# Patient Record
Sex: Female | Born: 1975 | Race: White | Hispanic: No | Marital: Single | Smoking: Current every day smoker
Health system: Southern US, Community
[De-identification: ages and names within clinical notes are randomized; demographics above are authoritative.]

## PROBLEM LIST (undated history)

## (undated) HISTORY — PX: KNEE SURGERY: SHX244

## (undated) HISTORY — PX: TONSILLECTOMY: SUR1361

## (undated) HISTORY — PX: TUBAL LIGATION: SHX77

## (undated) HISTORY — PX: CHOLECYSTECTOMY: SHX55

---

## 1999-01-29 ENCOUNTER — Ambulatory Visit (HOSPITAL_COMMUNITY): Admission: RE | Admit: 1999-01-29 | Discharge: 1999-01-29 | Payer: Self-pay | Admitting: *Deleted

## 2005-10-29 ENCOUNTER — Emergency Department: Payer: Self-pay | Admitting: Emergency Medicine

## 2016-11-13 ENCOUNTER — Encounter: Payer: Self-pay | Admitting: Emergency Medicine

## 2016-11-13 ENCOUNTER — Emergency Department
Admission: EM | Admit: 2016-11-13 | Discharge: 2016-11-13 | Disposition: A | Discharge status: Home / Self Care | Payer: Self-pay | Attending: Emergency Medicine | Admitting: Emergency Medicine

## 2016-11-13 ENCOUNTER — Emergency Department: Payer: Self-pay

## 2016-11-13 DIAGNOSIS — F17210 Nicotine dependence, cigarettes, uncomplicated: Secondary | ICD-10-CM | POA: Insufficient documentation

## 2016-11-13 DIAGNOSIS — Z9049 Acquired absence of other specified parts of digestive tract: Secondary | ICD-10-CM | POA: Insufficient documentation

## 2016-11-13 DIAGNOSIS — M549 Dorsalgia, unspecified: Secondary | ICD-10-CM | POA: Insufficient documentation

## 2016-11-13 DIAGNOSIS — R1030 Lower abdominal pain, unspecified: Secondary | ICD-10-CM | POA: Insufficient documentation

## 2016-11-13 DIAGNOSIS — R109 Unspecified abdominal pain: Secondary | ICD-10-CM

## 2016-11-13 DIAGNOSIS — R300 Dysuria: Secondary | ICD-10-CM | POA: Insufficient documentation

## 2016-11-13 DIAGNOSIS — Z711 Person with feared health complaint in whom no diagnosis is made: Secondary | ICD-10-CM | POA: Insufficient documentation

## 2016-11-13 LAB — URINALYSIS, COMPLETE (UACMP) WITH MICROSCOPIC
Bacteria, UA: NONE SEEN
Bilirubin Urine: NEGATIVE
Glucose, UA: NEGATIVE mg/dL
Ketones, ur: NEGATIVE mg/dL
Nitrite: NEGATIVE
Protein, ur: NEGATIVE mg/dL
Specific Gravity, Urine: 1.017 (ref 1.005–1.030)
pH: 5 (ref 5.0–8.0)

## 2016-11-13 LAB — PREGNANCY, URINE: Preg Test, Ur: NEGATIVE

## 2016-11-13 NOTE — ED Notes (Signed)
Pt to ed with c/o urinary frequency and discomfort intermittent x 2 weeks.  Denies back pain.

## 2016-11-13 NOTE — ED Triage Notes (Signed)
Urinary frequency and discomfort intermittent x 2 weeks.

## 2016-11-13 NOTE — ED Provider Notes (Signed)
[facility] Emergency Department Provider Note   ____________________________________________   I have reviewed the triage vital signs and the nursing notes.   HISTORY  Chief Complaint Dysuria    HPI Theresa Garrett is a 41 y.o. female presents to emergency department with dysuria, increased urinary frequency and intermittent lower abdominal pain for approximately 2 weeks. Patient also reports lumbar back pain and right side flank pain for approximately one week. Patient denies any recent traumatic injury or lifting injury in relation to back pain. Patient denies any significant history of recurring urinary tract infections kidney infections or kidney stones.Patient does not endorse fevers, chills, nausea, vomiting or headache associated with the above symptoms. Patient did not note any blood in her urine, change in color or odor. Patient denies fever, chills, headache, vision changes, chest pain, chest tightness, shortness of breath, abdominal pain, nausea and vomiting.  History reviewed. No pertinent past medical history.  There are no active problems to display for this patient.   Past Surgical History:  Procedure Laterality Date  . CHOLECYSTECTOMY    . KNEE SURGERY    . TONSILLECTOMY    . TUBAL LIGATION      Prior to Admission medications   Not on File    Allergies Ibuprofen and Morphine and related  No family history on file.  Social History Social History  Substance Use Topics  . Smoking status: Current Every Day Smoker    Packs/day: 0.25    Types: Cigarettes  . Smokeless tobacco: Not on file  . Alcohol use Not on file    Review of Systems Constitutional: Negative for fever/chills Cardiovascular: Denies chest pain. Respiratory: Denies cough. Denies shortness of breath. Gastrointestinal: Lower abdominal pain.  No nausea, vomiting, diarrhea. Genitourinary: Positive for dysuria and increased urine frequency. Musculoskeletal:  positive for lumbar back pain and right-sided flank pain. Skin: Negative for rash. Neurological: Negative for headaches.   ____________________________________________   PHYSICAL EXAM:  VITAL SIGNS: ED Triage Vitals  Enc Vitals Group     BP 11/13/16 0906 (!) 167/94     Pulse Rate 11/13/16 0906 100     Resp 11/13/16 0906 18     Temp 11/13/16 0906 97.6 F (36.4 C)     Temp Source 11/13/16 0906 Oral     SpO2 11/13/16 0906 100 %     Weight 11/13/16 0906 140 lb (63.5 kg)     Height 11/13/16 0906  (1.6 m)     Head Circumference --      Peak Flow --      Pain Score 11/13/16 0905 5     Pain Loc --      Pain Edu? --      Excl. in GC? --     Constitutional: Alert and oriented. Well appearing and in no acute distress.  Eyes: Conjunctivae are normal. PERRL. EOMI  Head: Normocephalic and atraumatic. Neck:Supple. No thyromegaly. No stridor.  Cardiovascular: Normal rate, regular rhythm. Good peripheral circulation. Respiratory: Normal respiratory effort without tachypnea or retractions. Lungs CTAB. No wheezes/rales/rhonchi. Good air entry to the bases with no decreased or absent breath sounds. Hematological/Lymphatic/Immunological: No cervical lymphadenopathy. Cardiovascular: Normal rate, regular rhythm. Normal distal pulses. Gastrointestinal: Bowel sounds 4 quadrants. Soft and nontender to palpation. No guarding or rigidity. No palpable masses. No distention. Right side CVA tenderness. Genitourinary: Positive for dysuria and increased urine frequency. No vaginal discharge. Musculoskeletal: Lumbar back and right side flank pain.  Neurologic: Normal speech and language.  Skin:  Skin is warm, dry and intact. No rash noted. Psychiatric: Mood and affect are normal. Speech and behavior are normal. Patient exhibits appropriate insight and judgement.  ____________________________________________   LABS (all labs ordered are listed, but only abnormal results are displayed)  Labs  Reviewed  URINALYSIS, COMPLETE (UACMP) WITH MICROSCOPIC - Abnormal; Notable for the following:       Result Value   Color, Urine YELLOW (*)    APPearance CLOUDY (*)    Hgb urine dipstick SMALL (*)    Leukocytes, UA LARGE (*)    Squamous Epithelial / LPF 6-30 (*)    All other components within normal limits  PREGNANCY, URINE   ____________________________________________  EKG none ____________________________________________  RADIOLOGY CT renal study IMPRESSION: 1. No acute finding. No hydronephrosis or urolithiasis. 2. Colonic diverticulosis. 3. 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 ____________________________________________   PROCEDURES  Procedure(s) performed: no    Critical Care performed: no ____________________________________________   INITIAL IMPRESSION / ASSESSMENT AND PLAN / ED COURSE  Pertinent labs & imaging results that were available during my care of the patient were reviewed by me and considered in my medical decision making (see chart for details).  Patient presents to emergency department with dysuria, increased frequency, back pain and right-sided flank pain. History, physical exam findings, labs and imaging are reassuring symptoms are not related to an infectious process ongoing. Recommended patient encourage hydration, and sure adequate diet forbowel and bladder functions and continue monitoring current symptoms she is experiencing for continued improvement. I discussed skeletal skeletal findings noted on her CT renal study with this being the possible source of her back pain. I offered her recommendations for pain management and she stated she would take over-the-counter Tylenol or NSAIDs. Reassessment of vital signs were reassuring at the time of discharge. All patient questions were answered. Patient advised to follow up with PCP as needed or return to the emergency department if symptoms return or worsen. Patient informed of  clinical course, understand medical decision-making process, and agree with plan.      ____________________________________________   FINAL CLINICAL IMPRESSION(S) / ED DIAGNOSES  Final diagnoses:  Dysuria  Flank pain  Other acute back pain  Medical condition not demonstrated       NEW MEDICATIONS STARTED DURING THIS VISIT:  There are no discharge medications for this patient.    Note:  This document was prepared using Dragon voice recognition software and may include unintentional dictation errors.    Clois Comber, PA-C 11/13/16 1639    Governor Rooks, MD 11/18/16 [phone]

## 2016-11-13 NOTE — Discharge Instructions (Signed)
If you note symptoms not improving or significantly worsening do not hesitate to return to the emergency department.

## 2017-05-09 ENCOUNTER — Emergency Department
Admission: EM | Admit: 2017-05-09 | Discharge: 2017-05-09 | Disposition: A | Discharge status: Home / Self Care | Payer: Self-pay | Attending: Emergency Medicine | Admitting: Emergency Medicine

## 2017-05-09 ENCOUNTER — Encounter: Payer: Self-pay | Admitting: Medical Oncology

## 2017-05-09 DIAGNOSIS — R1013 Epigastric pain: Secondary | ICD-10-CM | POA: Insufficient documentation

## 2017-05-09 DIAGNOSIS — R197 Diarrhea, unspecified: Secondary | ICD-10-CM | POA: Insufficient documentation

## 2017-05-09 DIAGNOSIS — R52 Pain, unspecified: Secondary | ICD-10-CM

## 2017-05-09 DIAGNOSIS — R112 Nausea with vomiting, unspecified: Secondary | ICD-10-CM | POA: Insufficient documentation

## 2017-05-09 DIAGNOSIS — F17210 Nicotine dependence, cigarettes, uncomplicated: Secondary | ICD-10-CM | POA: Insufficient documentation

## 2017-05-09 LAB — URINALYSIS, COMPLETE (UACMP) WITH MICROSCOPIC
BILIRUBIN URINE: NEGATIVE
Bacteria, UA: NONE SEEN
GLUCOSE, UA: NEGATIVE mg/dL
HGB URINE DIPSTICK: NEGATIVE
Ketones, ur: NEGATIVE mg/dL
NITRITE: NEGATIVE
Protein, ur: NEGATIVE mg/dL
Specific Gravity, Urine: 1.021 (ref 1.005–1.030)
pH: 5 (ref 5.0–8.0)

## 2017-05-09 LAB — COMPREHENSIVE METABOLIC PANEL
ALBUMIN: 4.2 g/dL (ref 3.5–5.0)
ALK PHOS: 46 U/L (ref 38–126)
ALT: 12 U/L — ABNORMAL LOW (ref 14–54)
ANION GAP: 7 (ref 5–15)
AST: 16 U/L (ref 15–41)
BILIRUBIN TOTAL: 0.6 mg/dL (ref 0.3–1.2)
BUN: 18 mg/dL (ref 6–20)
CALCIUM: 8.5 mg/dL — AB (ref 8.9–10.3)
CO2: 21 mmol/L — ABNORMAL LOW (ref 22–32)
Chloride: 110 mmol/L (ref 101–111)
Creatinine, Ser: 0.57 mg/dL (ref 0.44–1.00)
GFR calc Af Amer: >60 mL/min (ref >60)
Glucose, Bld: 114 mg/dL — ABNORMAL HIGH (ref 65–99)
POTASSIUM: 3.9 mmol/L (ref 3.5–5.1)
Sodium: 138 mmol/L (ref 135–145)
TOTAL PROTEIN: 7.3 g/dL (ref 6.5–8.1)

## 2017-05-09 LAB — CBC
HCT: 34.6 % — ABNORMAL LOW (ref 35.0–47.0)
HEMOGLOBIN: 10.9 g/dL — AB (ref 12.0–16.0)
MCH: 24.7 pg — ABNORMAL LOW (ref 26.0–34.0)
MCHC: 31.5 g/dL — ABNORMAL LOW (ref 32.0–36.0)
MCV: 78.4 fL — ABNORMAL LOW (ref 80.0–100.0)
Platelets: 234 10*3/uL (ref 150–440)
RBC: 4.42 MIL/uL (ref 3.80–5.20)
RDW: 17.4 % — AB (ref 11.5–14.5)
WBC: 14.8 10*3/uL — AB (ref 3.6–11.0)

## 2017-05-09 LAB — INFLUENZA PANEL BY PCR (TYPE A & B)
Influenza A By PCR: NEGATIVE
Influenza B By PCR: NEGATIVE

## 2017-05-09 LAB — POC URINE PREG, ED: Preg Test, Ur: NEGATIVE

## 2017-05-09 LAB — LIPASE, BLOOD: Lipase: 23 U/L (ref 11–51)

## 2017-05-09 MED ORDER — ONDANSETRON 4 MG PO TBDP: 4.0000 mg | ORAL_TABLET | Freq: Once | ORAL | Status: DC | PRN

## 2017-05-09 MED ORDER — ONDANSETRON 4 MG PO TBDP
ORAL_TABLET | ORAL | Status: AC
  Filled 2017-05-09: qty 1

## 2017-05-09 MED ORDER — LOPERAMIDE HCL 2 MG PO CAPS
4.0000 mg | ORAL_CAPSULE | Freq: Once | ORAL | Status: AC
  Administered 2017-05-09: 4 mg via ORAL
  Filled 2017-05-09: qty 2

## 2017-05-09 MED ORDER — SODIUM CHLORIDE 0.9 % IV BOLUS
1000.0000 mL | Freq: Once | INTRAVENOUS | Status: AC
  Administered 2017-05-09: 1000 mL via INTRAVENOUS

## 2017-05-09 MED ORDER — LOPERAMIDE HCL 2 MG PO TABS: 2.0000 mg | ORAL_TABLET | Freq: Four times a day (QID) | ORAL | 0 refills | Status: AC | PRN

## 2017-05-09 MED ORDER — ONDANSETRON HCL 4 MG/2ML IJ SOLN
4.0000 mg | Freq: Once | INTRAMUSCULAR | Status: AC
  Administered 2017-05-09: 4 mg via INTRAVENOUS
  Filled 2017-05-09: qty 2

## 2017-05-09 MED ORDER — ONDANSETRON 4 MG PO TBDP
4.0000 mg | ORAL_TABLET | Freq: Three times a day (TID) | ORAL | 0 refills | Status: AC | PRN
Start: 2017-05-09 — End: ?

## 2017-05-09 NOTE — ED Notes (Signed)
MD at bedside to talk to pt about results.

## 2017-05-09 NOTE — Discharge Instructions (Addendum)
Please take a clear liquid diet for the next 24-48 hours, then advance to a bland diet as tolerated.  You may take Zofran for nausea and vomiting, and loperamide is for diarrhea. ° °Make sure that you are washing your hands frequently and thoroughly to prevent the spread of infection. ° °Return to the emergency department if you develop lightheadedness, fainting, severe pain, fever, or any other symptoms concerning to you. °

## 2017-05-09 NOTE — ED Triage Notes (Signed)
Pt reports this am she began to have body aches, low grade temp, abd pain with nausea and vomiting.

## 2017-05-09 NOTE — ED Provider Notes (Signed)
[facility] Emergency Department Provider Note  ____________________________________________  Time seen: Approximately 12:26 PM  I have reviewed the triage vital signs and the nursing notes.   HISTORY  Chief Complaint Emesis; Generalized Body Aches; and Abdominal Pain    HPI Theresa Garrett is a 42 y.o. female, with a known PUD, presenting with acute onset of nausea vomiting and diarrhea, diffuse abdominal cramping and body aches.  The patient reports that she drove her daughter to work at 7:30 AM and by the time she got home, she was unable to tolerate anything.  She tried Sprite but this did not calm her stomach.  She has a history of peptic ulcer disease cramping in the area of her ulcer but this is not out of the norm for her.  She has not had any fever, chills, dysuria, urinary frequency.  The patient did have her influenza vaccination this year.  History reviewed. No pertinent past medical history.  There are no active problems to display for this patient.   Past Surgical History:  Procedure Laterality Date  . CHOLECYSTECTOMY    . KNEE SURGERY    . TONSILLECTOMY    . TUBAL LIGATION        Allergies Ibuprofen and Morphine and related  No family history on file.  Social History Social History   Tobacco Use  . Smoking status: Current Every Day Smoker    Packs/day: 0.25    Types: Cigarettes  Substance Use Topics  . Alcohol use: Not on file  . Drug use: Not on file    Review of Systems Constitutional: No fever/chills.  Lightheadedness or syncope.  Positive generalized malaise.  Positive myalgias.   Eyes: No visual changes.  No eye discharge. ENT: No sore throat. No congestion or rhinorrhea.  No ear pain. Cardiovascular: Denies chest pain. Denies palpitations. Respiratory: Denies shortness of breath.  No cough. Gastrointestinal: Positive epigastric chronic unchanged abdominal pain.  +nausea, +vomiting.  +diarrhea.  No  constipation. Genitourinary: Negative for dysuria.  No hematuria.  No urinary frequency. Musculoskeletal: Negative for back pain. Skin: Negative for rash. Neurological: Negative for headaches. No focal numbness, tingling or weakness.    ____________________________________________   PHYSICAL EXAM:  VITAL SIGNS: ED Triage Vitals [05/09/17 1150]  Enc Vitals Group     BP 140/81     Pulse Rate 91     Resp 18     Temp 99.2 F (37.3 C)     Temp Source Oral     SpO2 98 %     Weight 140 lb (63.5 kg)     Height 5\' 3"  (1.6 m)     Head Circumference      Peak Flow      Pain Score 7     Pain Loc      Pain Edu?      Excl. in GC?     Constitutional: Alert and oriented. Well appearing and in no acute distress. Answers questions appropriately. Eyes: Conjunctivae are normal.  EOMI. No scleral icterus.  No eye discharge. Head: Atraumatic. Nose: Positive congestion without rhinorrhea. Mouth/Throat: Mucous membranes are moist.  No posterior pharyngeal erythema, tonsillar swelling or exudate.  Posterior palate is symmetric and uvula is midline peer Neck: No stridor.  Supple.  No JVD.  No meningismus. Cardiovascular: Normal rate, regular rhythm. No murmurs, rubs or gallops.  Respiratory: Normal respiratory effort.  No accessory muscle use or retractions. Lungs CTAB.  No wheezes, rales or ronchi. Gastrointestinal: Soft, and nondistended.  Minimal epigastric tenderness to palpation.  No right upper quadrant pain or Murphy sign.  No guarding or rebound.  No peritoneal signs. Musculoskeletal: No LE edema. Neurologic:  A&Ox3.  Speech is clear.  Face and smile are symmetric.  EOMI.  Moves all extremities well. Skin:  Skin is warm, dry and intact. No rash noted. Psychiatric: Mood and affect are normal. Speech and behavior are normal.  Normal judgement.  ____________________________________________   LABS (all labs ordered are listed, but only abnormal results are displayed)  Labs Reviewed   COMPREHENSIVE METABOLIC PANEL - Abnormal; Notable for the following components:      Result Value   CO2 21 (*)    Glucose, Bld 114 (*)    Calcium 8.5 (*)    ALT 12 (*)    All other components within normal limits  CBC - Abnormal; Notable for the following components:   WBC 14.8 (*)    Hemoglobin 10.9 (*)    HCT 34.6 (*)    MCV 78.4 (*)    MCH 24.7 (*)    MCHC 31.5 (*)    RDW 17.4 (*)    All other components within normal limits  URINALYSIS, COMPLETE (UACMP) WITH MICROSCOPIC - Abnormal; Notable for the following components:   Color, Urine YELLOW (*)    APPearance HAZY (*)    Leukocytes, UA TRACE (*)    Squamous Epithelial / LPF 6-30 (*)    All other components within normal limits  LIPASE, BLOOD  INFLUENZA PANEL BY PCR (TYPE A & B)  POC URINE PREG, ED   ____________________________________________  EKG  Not indicated ____________________________________________  RADIOLOGY  No results found.  ____________________________________________   PROCEDURES  Procedure(s) performed: None  Procedures  Critical Care performed: No ____________________________________________   INITIAL IMPRESSION / ASSESSMENT AND PLAN / ED COURSE  Pertinent labs & imaging results that were available during my care of the patient were reviewed by me and considered in my medical decision making (see chart for details).  42 y.o. female presenting with acute onset of nausea vomiting diarrhea, body aches and epigastric discomfort which is not grossly abnormal from her chronic peptic ulcer disease pain.  Overall, the patient is well-appearing and afebrile.  Her abdominal examination is reassuring and I do not suspect an intra-abdominal surgical pathology or life-threatening infection today.  Her symptoms are most consistent with viral or foodborne GI illness, or influenza.  We will plan to treat the patient symptomatically, and get basic laboratory studies.  Rule out pregnancy and UTI.  Reevaluate  for final disposition.  ----------------------------------------- 1:41 PM on 05/09/2017 -----------------------------------------  The patient's influenza testing and pregnancy test are negative.  Her lipase is normal and her electrolytes are reassuring.  Her hepatic function panel is also reassuring.  Her CBC shows a white blood cell count of 14.8 which is consistent with her infectious presentation.  Her UA does not show a UTI.  At this time, the patient is feeling significantly better and her symptoms have resolved.  She is able to take liquids without vomiting.  At this time the patient is stable for discharge; follow-up instructions as well as return precautions were discussed.  ____________________________________________  FINAL CLINICAL IMPRESSION(S) / ED DIAGNOSES  Final diagnoses:  Nausea vomiting and diarrhea  Epigastric pain  Generalized body aches         NEW MEDICATIONS STARTED DURING THIS VISIT:  New Prescriptions   LOPERAMIDE (IMODIUM A-D) 2 MG TABLET    Take 1 tablet (2 mg total)  by mouth 4 (four) times daily as needed for diarrhea or loose stools.   ONDANSETRON (ZOFRAN ODT) 4 MG DISINTEGRATING TABLET    Take 1 tablet (4 mg total) by mouth every 8 (eight) hours as needed for nausea or vomiting.      Rockne MenghiniNorman, Anne-Caroline, MD 05/09/17 [phone]

## 2019-01-05 ENCOUNTER — Other Ambulatory Visit: Payer: Self-pay

## 2019-01-05 DIAGNOSIS — Z20822 Contact with and (suspected) exposure to covid-19: Secondary | ICD-10-CM

## 2019-01-08 LAB — NOVEL CORONAVIRUS, NAA: SARS-CoV-2, NAA: NOT DETECTED

## 2019-05-05 IMAGING — CT CT RENAL STONE PROTOCOL
3 of 4 series · 8 of 46 positions shown, 15 images · non-contrast
Comparison: None.

CLINICAL DATA: Flank pain on the left for 2 weeks. Stone disease
suspected.

EXAM:
CT ABDOMEN AND PELVIS WITHOUT CONTRAST
TECHNIQUE: Multidetector CT imaging of the abdomen and pelvis was performed
following the standard protocol without IV contrast.

[Series 4: lung bases · axial · 0.76mm/px · z∈[-168,-108]mm · 4 of 20 slices shown, 9 images]
[im 4/20  soft-tissue]
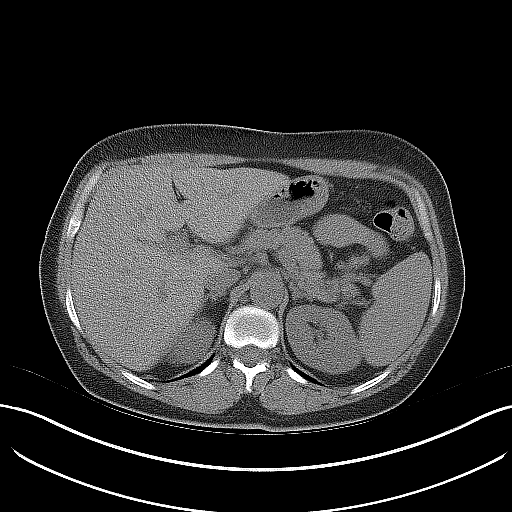
[im 4/20  lung]
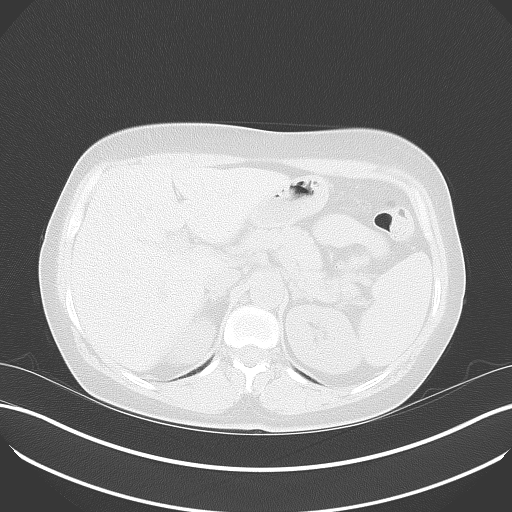
[im 4/20  bone]
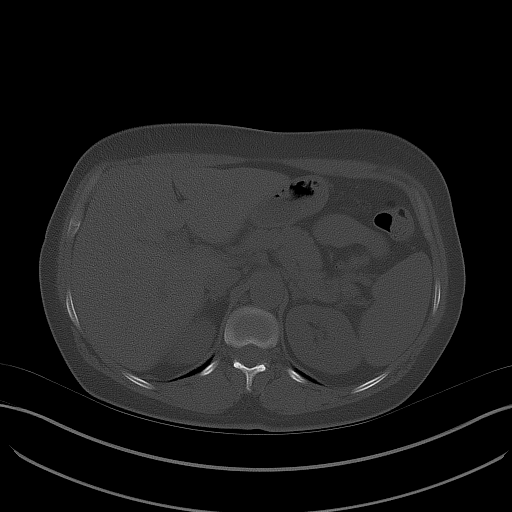
[im 8/20  soft-tissue]
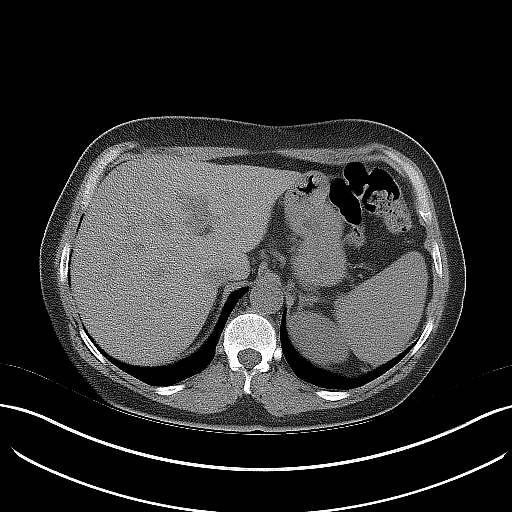
[im 8/20  lung]
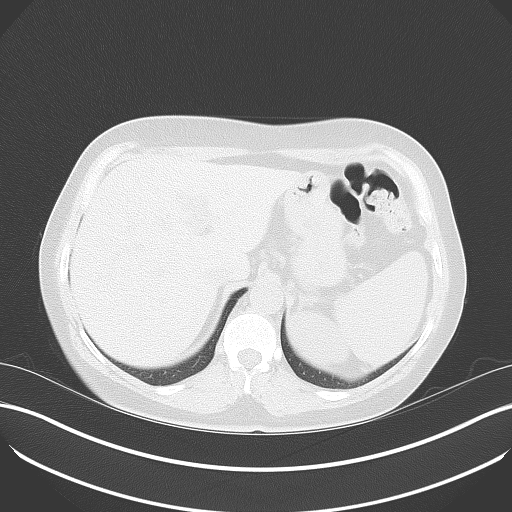
[im 12/20  soft-tissue]
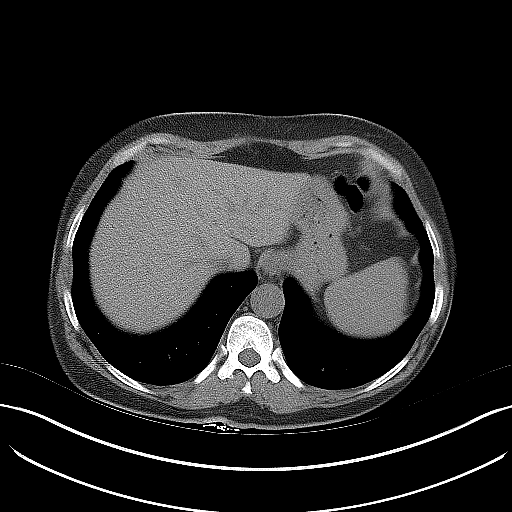
[im 12/20  lung]
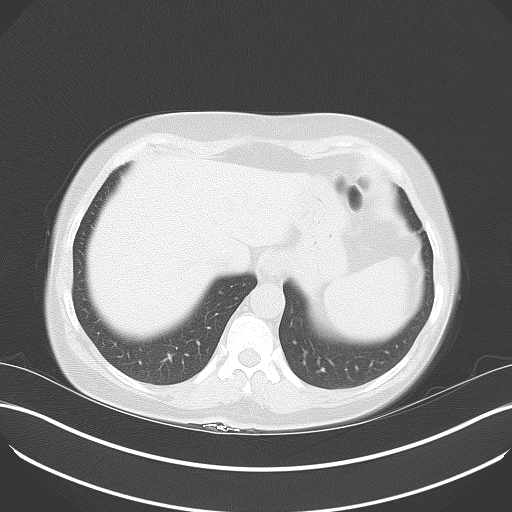
[im 16/20  soft-tissue]
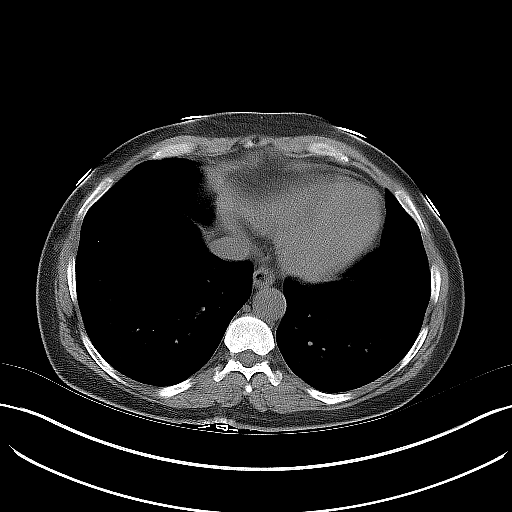
[im 16/20  lung]
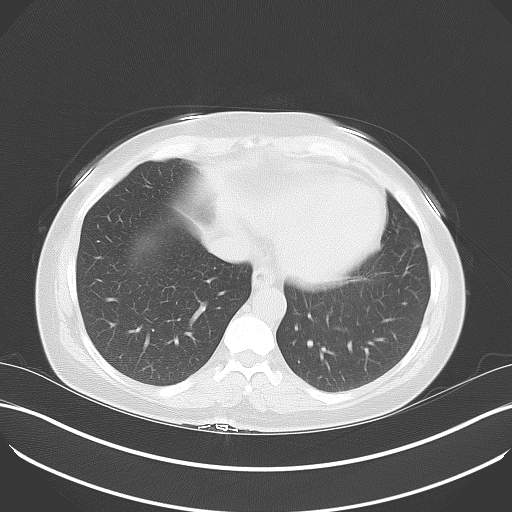

[Series 5: coronal · coronal · 0.72mm/px · 3 of 122 slices shown, 4 images]
[im 41/122  soft-tissue]
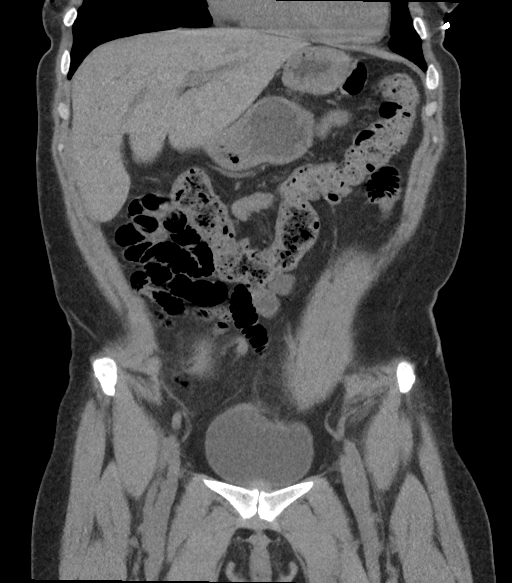
[im 54/122  soft-tissue]
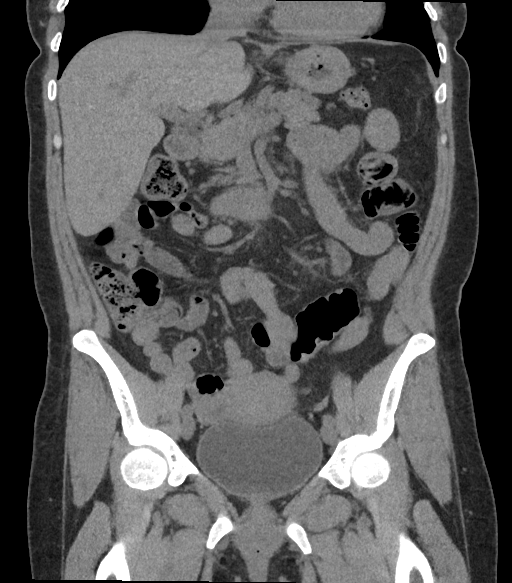
[im 54/122  bone]
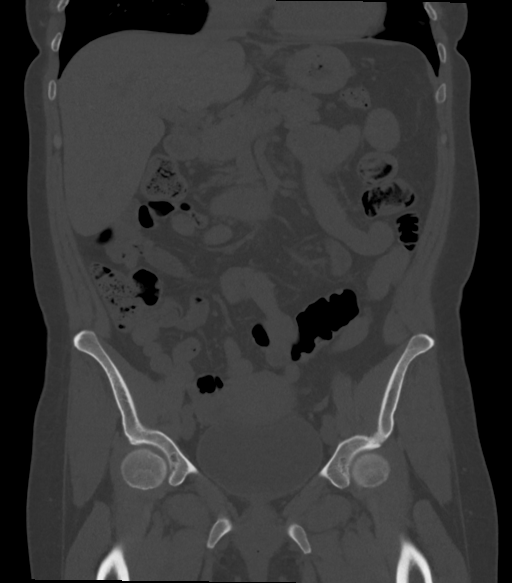
[im 68/122  soft-tissue]
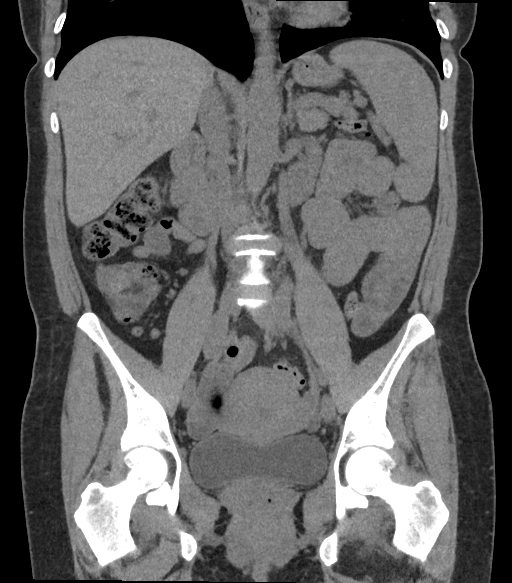

[Series 6: sagittal · sagittal · 0.51mm/px · 1 of 165 slices shown, 2 images]
[im 55/165  soft-tissue]
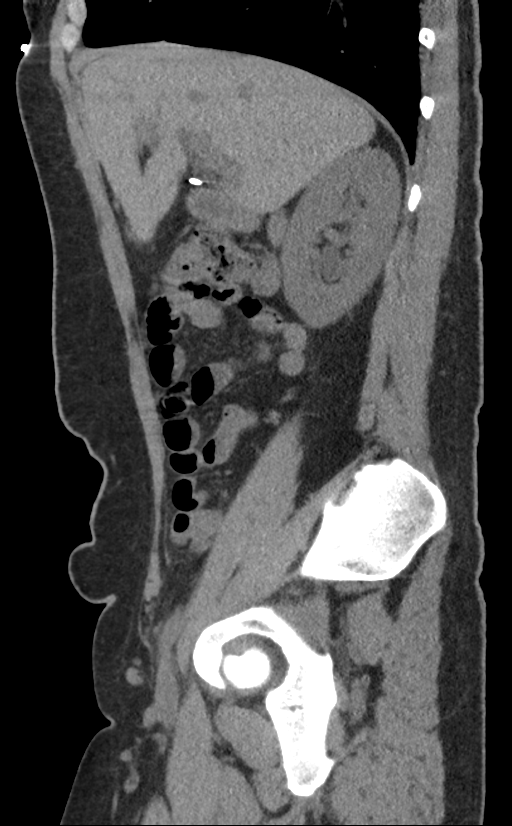
[im 55/165  bone]
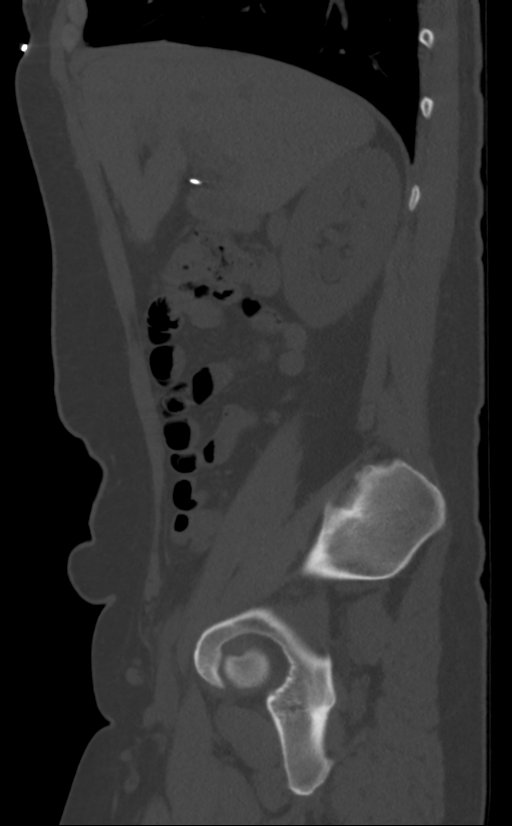

[8 of 46 positions shown; findings below may reference images not displayed]

FINDINGS: Lower chest:  No contributory findings.

Hepatobiliary: No focal liver abnormality.Cholecystectomy. Normal
common bile duct diameter.

Pancreas: Unremarkable.

Spleen: Unremarkable.

Adrenals/Urinary Tract: Negative adrenals. No hydronephrosis or
stone. Unremarkable bladder.

Stomach/Bowel: No obstruction. Few scattered distal colonic
diverticula. No appendicitis.

Vascular/Lymphatic: No acute vascular abnormality. No mass or
adenopathy.

Reproductive:No pathologic findings.

Other: No ascites or pneumoperitoneum.

Musculoskeletal: No acute abnormalities. Chronic bilateral L3 pars
defects. Focal L3-4 disc degeneration with disc narrowing and
bulging.
IMPRESSION: 1. No acute finding.  No hydronephrosis or urolithiasis.
2. Colonic diverticulosis.
3. Chronic bilateral L3 pars defects with L3-4 disc degeneration.

## 2020-01-06 ENCOUNTER — Other Ambulatory Visit: Payer: Self-pay

## 2020-01-06 ENCOUNTER — Encounter: Payer: Self-pay | Admitting: Emergency Medicine

## 2020-01-06 ENCOUNTER — Emergency Department: Payer: Medicaid Other

## 2020-01-06 ENCOUNTER — Emergency Department
Admission: EM | Admit: 2020-01-06 | Discharge: 2020-01-06 | Disposition: A | Discharge status: Home / Self Care | Payer: Medicaid Other | Attending: Emergency Medicine | Admitting: Emergency Medicine

## 2020-01-06 DIAGNOSIS — S92902K Unspecified fracture of left foot, subsequent encounter for fracture with nonunion: Secondary | ICD-10-CM

## 2020-01-06 DIAGNOSIS — S92342A Displaced fracture of fourth metatarsal bone, left foot, initial encounter for closed fracture: Secondary | ICD-10-CM | POA: Insufficient documentation

## 2020-01-06 DIAGNOSIS — X58XXXA Exposure to other specified factors, initial encounter: Secondary | ICD-10-CM | POA: Insufficient documentation

## 2020-01-06 DIAGNOSIS — F17210 Nicotine dependence, cigarettes, uncomplicated: Secondary | ICD-10-CM | POA: Insufficient documentation

## 2020-01-06 DIAGNOSIS — S92332A Displaced fracture of third metatarsal bone, left foot, initial encounter for closed fracture: Secondary | ICD-10-CM | POA: Insufficient documentation

## 2020-01-06 MED ORDER — KETOROLAC TROMETHAMINE 60 MG/2ML IM SOLN
30.0000 mg | Freq: Once | INTRAMUSCULAR | Status: AC
  Administered 2020-01-06: 30 mg via INTRAMUSCULAR
  Filled 2020-01-06: qty 2

## 2020-01-06 MED ORDER — MELOXICAM 15 MG PO TABS: 15.0000 mg | ORAL_TABLET | Freq: Every day | ORAL | 0 refills | Status: AC

## 2020-01-06 NOTE — ED Triage Notes (Signed)
Pt to ED via POV c/o pain and swelling in her left foot. Pt states that she has an old fracture in that foot. Pt denies recent injury. Pt states that she has been working about 80 hours a week. Pt states that the pain started a few days ago but wasn't bad. Pt worked 16 hours yesterday and today the pain and swelling are much worse. Pt has significant swelling in her left foot, color is WNL.

## 2020-01-07 NOTE — ED Provider Notes (Signed)
[facility] Emergency Department Provider Note  ____________________________________________   First MD Initiated Contact with Patient 01/06/20 1322     (approximate)  I have reviewed the triage vital signs and the nursing notes.   HISTORY  Chief Complaint Foot Pain  HPI Theresa Garrett is a 44 y.o. female who presents to the emergency department for evaluation of left foot pain.  The patient states that she has had left foot pain that has been worsening over the last 3 days.  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  She reports history of a fracture in her left foot that she believes was close to 20 years ago.  She states that intermittently, she has been known to "aggravate" the foot but that it quickly resolves with anti-inflammatories and Tylenol.  She states this time, her pain did not improve and has worsened.  She has associated swelling that she is concerned about.  She currently rates her pain an 8/10, but is a 10/10 with any weightbearing of the left foot.  While she has tried anti-inflammatories and Tylenol for this current episode, she has not had any relief with it.         History reviewed. No pertinent past medical history.  There are no problems to display for this patient.   Past Surgical History:  Procedure Laterality Date  . CHOLECYSTECTOMY    . KNEE SURGERY    . TONSILLECTOMY    . TUBAL LIGATION      Prior to Admission medications   Medication Sig Start Date End Date Taking? Authorizing Provider  loperamide (IMODIUM A-D) 2 MG tablet Take 1 tablet (2 mg total) by mouth 4 (four) times daily as needed for diarrhea or loose stools. 05/09/17   Rockne Menghini, MD  meloxicam (MOBIC) 15 MG tablet Take 1 tablet (15 mg total) by mouth daily. 01/06/20 02/05/20  Lucy Chris, PA  ondansetron (ZOFRAN ODT) 4 MG  disintegrating tablet Take 1 tablet (4 mg total) by mouth every 8 (eight) hours as needed for nausea or vomiting. 05/09/17   Rockne Menghini, MD    Allergies Ibuprofen and Morphine and related  No family history on file.  Social History Social History   Tobacco Use  . Smoking status: Current Every Day Smoker    Packs/day: 0.25    Types: Cigarettes  . Smokeless tobacco: Never Used  Substance Use Topics  . Alcohol use: Not Currently  . Drug use: Not Currently    Review of Systems Constitutional: No fever/chills Eyes: No visual changes. ENT: No sore throat. Cardiovascular: Denies chest pain. Respiratory: Denies shortness of breath. Gastrointestinal: No abdominal pain.  No nausea, no vomiting.  No diarrhea.  No constipation. Genitourinary: Negative for dysuria. Musculoskeletal: + Left foot pain, negative for back pain. Skin: Negative for rash. Neurological: Negative for headaches, focal weakness or numbness.   ____________________________________________   PHYSICAL EXAM:  VITAL SIGNS: ED Triage Vitals  Enc Vitals Group     BP 01/06/20 1312 (!) 177/106     Pulse Rate 01/06/20 1312 (!) 103     Resp 01/06/20 1312 16     Temp 01/06/20 1312 98 F (36.7 C)     Temp Source 01/06/20 1312 Oral     SpO2 01/06/20 1312 97 %     Weight 01/06/20 1313 165 lb (74.8 kg)     Height  01/06/20 1313 5\' 3"  (1.6 m)     Head Circumference --      Peak Flow --      Pain Score 01/06/20 1313 8     Pain Loc --      Pain Edu? --      Excl. in [place]? --    Constitutional: Alert and oriented. Well appearing and in no acute distress. Eyes: Conjunctivae are normal. PERRL. EOMI. Head: Atraumatic. Nose: No congestion/rhinnorhea. Mouth/Throat: Mucous membranes are moist.  Neck: No stridor.   Cardiovascular: Tachycardic, regular rhythm.   Good peripheral circulation. Respiratory: Normal respiratory effort.  No retractions. Musculoskeletal: There is mild to moderate soft tissue swelling over  the dorsum of the left foot from approximately the midfoot to the base of the toes.  There is very mild erythema associated with the swelling.  There is tenderness to palpation of the second third and fourth rays, most prominent towards the proximal aspect of the radius.  Patient has full range of motion of the left toes as compared to the right without pain.  Full range of motion of the left ankle without pain.  Dorsalis pedis and posterior tibial pulses 2+ Neurologic:  Normal speech and language. No gross focal neurologic deficits are appreciated.  Gait not assessed secondary to left foot pain. Skin:  Skin is warm, dry and intact. No rash noted. Psychiatric: Mood and affect are normal. Speech and behavior are normal.   ____________________________________________  RADIOLOGY I, 01/08/20, personally viewed and evaluated these images (plain radiographs) as part of my medical decision making, as well as reviewing the written report by the radiologist.  ED provider interpretation: There are old fractures identified of the second through fifth metatarsals, third and fourth appear to be nonunion fractures.  No acute fractures identified.   ____________________________________________   INITIAL IMPRESSION / ASSESSMENT AND PLAN / ED COURSE  As part of my medical decision making, I reviewed the following data within the electronic MEDICAL RECORD NUMBER Nursing notes reviewed and incorporated, Radiograph reviewed and Notes from prior ED visits        Patient is a 44 year old female who presents to the emergency department for evaluation of left foot pain.  She states that she had an old injury approximately 20 years ago and that she intermittently will "flare it" it up but that this time it did not respond to her Tylenol and ibuprofen.  On physical exam today, the patient does have a mild to moderate amount of soft tissue swelling on the dorsum of the foot as well as tenderness to palpation of the  second through fourth metatarsals.  Patient is neurovascularly intact.  X-rays reveal what appears to be old fractures of the second through fifth metatarsals with nonunion type of appearance on the third and fourth metatarsals.  Patient was educated on her x-ray findings.  These appear chronic in nature and no acute injuries were identified.  However, the patient is a smoker and is high risk for stress fractures or other nonunion injuries.  We will place the patient in a walking boot and on crutches for relief of her symptoms.  Also offered the patient a shot of Toradol as well as outpatient anti-inflammatories to take in addition to her Tylenol.  The patient is amenable with this plan and is stable at this time for outpatient therapy.  She will be referred to Ortho for follow-up.      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 She was evaluated in the context of the global COVID-19 pandemic, which necessitated consideration that the patient might be at risk for infection with the SARS-CoV-2 virus that causes COVID-19. Institutional protocols and algorithms that pertain to the evaluation of patients at risk for COVID-19 are in a state of rapid change based on information released by regulatory bodies including the CDC and federal and state organizations. These policies and algorithms were followed during the patient's care in the ED.  Some ED evaluations and interventions may be delayed as a result of limited staffing during and the pandemic.*   Note:  This document was prepared using Dragon voice  recognition software and may include unintentional dictation errors.    Lucy Chris, PA 01/07/20 1719    Dionne Bucy, MD 01/07/20 [phone]

## 2022-05-12 ENCOUNTER — Emergency Department
Admission: EM | Admit: 2022-05-12 | Discharge: 2022-05-12 | Disposition: A | Discharge status: Home / Self Care | Payer: 59 | Attending: Emergency Medicine | Admitting: Emergency Medicine

## 2022-05-12 ENCOUNTER — Other Ambulatory Visit: Payer: Self-pay

## 2022-05-12 ENCOUNTER — Emergency Department: Payer: 59

## 2022-05-12 DIAGNOSIS — M940 Chondrocostal junction syndrome [Tietze]: Secondary | ICD-10-CM | POA: Insufficient documentation

## 2022-05-12 DIAGNOSIS — J90 Pleural effusion, not elsewhere classified: Secondary | ICD-10-CM | POA: Insufficient documentation

## 2022-05-12 DIAGNOSIS — J208 Acute bronchitis due to other specified organisms: Secondary | ICD-10-CM | POA: Insufficient documentation

## 2022-05-12 DIAGNOSIS — R059 Cough, unspecified: Secondary | ICD-10-CM | POA: Diagnosis present

## 2022-05-12 MED ORDER — PREDNISONE 10 MG (21) PO TBPK: ORAL_TABLET | ORAL | 0 refills | Status: AC

## 2022-05-12 MED ORDER — AMOXICILLIN 875 MG PO TABS: 875.0000 mg | ORAL_TABLET | Freq: Two times a day (BID) | ORAL | 0 refills | Status: AC

## 2022-05-12 MED ORDER — HYDROCODONE-ACETAMINOPHEN 5-325 MG PO TABS: 1.0000 | ORAL_TABLET | Freq: Four times a day (QID) | ORAL | 0 refills | Status: AC | PRN

## 2022-05-12 NOTE — ED Provider Notes (Signed)
[facility] Provider Note    Event Date/Time   First MD Initiated Contact with Patient 05/12/22 1504     (approximate)   History   Cough and Rib Injury   HPI  Theresa Garrett is a 47 y.o. female with no significant past medical history presents emergency department complaining of left rib pain after coughing.  Patient states she has had a cough for approximately 1 week.  Denies any fever or chills.  States it is painful to take a deep breath, and to move.  States pain radiates laterally down her spine.  Denies shortness of breath      Physical Exam   Triage Vital Signs: ED Triage Vitals  Enc Vitals Group     BP 05/12/22 1435 (!) 180/112     Pulse Rate 05/12/22 1435 (!) 102     Resp 05/12/22 1435 20     Temp 05/12/22 1435 98.2 F (36.8 C)     Temp Source 05/12/22 1435 Oral     SpO2 05/12/22 1435 97 %     Weight 05/12/22 1437 175 lb (79.4 kg)     Height 05/12/22 1437 5\' 3"  (1.6 m)     Head Circumference --      Peak Flow --      Pain Score 05/12/22 1436 10     Pain Loc --      Pain Edu? --      Excl. in [place]? --     Most recent vital signs: Vitals:   05/12/22 1435  BP: (!) 180/112  Pulse: (!) 102  Resp: 20  Temp: 98.2 F (36.8 C)  SpO2: 97%     General: Awake, no distress.   CV:  Good peripheral perfusion. regular rate and  rhythm Resp:  Normal effort. Lungs cta Abd:  No distention.   Other:      ED Results / Procedures / Treatments   Labs (all labs ordered are listed, but only abnormal results are displayed) Labs Reviewed - No data to display   EKG     RADIOLOGY Chest x-ray    PROCEDURES:   Procedures   MEDICATIONS ORDERED IN ED: Medications - No data to display   IMPRESSION / MDM / [place] / ED COURSE  I reviewed the triage vital signs and the nursing notes.                              Differential diagnosis includes, but is not limited to, CAP, acute bronchitis, rib fracture,  costochondritis, pleural effusion  Patient's presentation is most consistent with acute complicated illness / injury requiring diagnostic workup.   Patient's exam is consistent with acute bronchitis, will do chest x-ray to assess for fracture, CAP or pleural effusion.  Chest x-ray was independently reviewed and interpreted by me as being negative for CAP, does show a small pleural effusion on the left.  This is in the area of pain.  I did discuss these findings with patient.  She was placed on antibiotic, steroid pack, and pain medication.  She is to follow-up with her regular doctor if not improving to 3 days.  Return emergency department worsening.  She is in agreement treatment plan.  Discharged stable condition.      FINAL CLINICAL IMPRESSION(S) / ED DIAGNOSES   Final diagnoses:  Pleural effusion  Costochondritis  Acute bronchitis due to other specified organisms  Rx / DC Orders   ED Discharge Orders          Ordered    amoxicillin (AMOXIL) 875 MG tablet  2 times daily        05/12/22 1512    predniSONE (STERAPRED UNI-PAK 21 TAB) 10 MG (21) TBPK tablet        05/12/22 1512    HYDROcodone-acetaminophen (NORCO/VICODIN) 5-325 MG tablet  Every 6 hours PRN        05/12/22 1512             Note:  This document was prepared using Dragon voice recognition software and may include unintentional dictation errors.    Versie Starks, PA-C 05/12/22 1535    Arta Silence, MD 05/12/22 [phone]

## 2022-05-12 NOTE — Discharge Instructions (Signed)
Follow up with your regular doctor as needed, return if worsening Use the medication as prescribed Beware sedation with the vicodin, also concerns for addiction when taking a narcotic Apply ice to left rib

## 2022-05-12 NOTE — ED Triage Notes (Signed)
Pt to ED POV for L rib pain since Saturday. States has been coughing for 1 week and pain started on its own while at work. Denies known injury. Painful to breathe deeply, move, cough, or lay on side. States cough has gotten better but L rib pain is severe and sharp and constant. States she thinks she may have broken a rib from coughing. Does not want covid test at this time unless necessary. Denies SOB.

## 2022-05-25 ENCOUNTER — Emergency Department: Payer: 59

## 2022-05-25 ENCOUNTER — Emergency Department
Admission: EM | Admit: 2022-05-25 | Discharge: 2022-05-26 | Disposition: A | Discharge status: Home / Self Care | Payer: 59 | Attending: Emergency Medicine | Admitting: Emergency Medicine

## 2022-05-25 ENCOUNTER — Encounter: Payer: Self-pay | Admitting: Emergency Medicine

## 2022-05-25 DIAGNOSIS — S8991XA Unspecified injury of right lower leg, initial encounter: Secondary | ICD-10-CM | POA: Diagnosis present

## 2022-05-25 DIAGNOSIS — W010XXA Fall on same level from slipping, tripping and stumbling without subsequent striking against object, initial encounter: Secondary | ICD-10-CM | POA: Diagnosis not present

## 2022-05-25 DIAGNOSIS — Y92009 Unspecified place in unspecified non-institutional (private) residence as the place of occurrence of the external cause: Secondary | ICD-10-CM | POA: Diagnosis not present

## 2022-05-25 MED ORDER — OXYCODONE HCL 5 MG PO TABS: 5.0000 mg | ORAL_TABLET | Freq: Four times a day (QID) | ORAL | 0 refills | Status: AC | PRN

## 2022-05-25 MED ORDER — OXYCODONE HCL 5 MG PO TABS
5.0000 mg | ORAL_TABLET | Freq: Once | ORAL | Status: AC
  Administered 2022-05-25: 5 mg via ORAL
  Filled 2022-05-25: qty 1

## 2022-05-25 NOTE — ED Triage Notes (Signed)
Pt presents in a wheelchair to triage via POV with complaints of R knee injury following a fall this AM in her house. Pt tripped landing on her R knee - visible swelling in comparison to the L knee and is unable to bear weight on the R leg due to pain.  Rates pain 10/10 took a goody powder PTA with no improvement. Pt still has sensation in her R extremity with adequate perfusion. A&Ox4 at this time. Denies hitting her head, LOC,  CP or SOB.

## 2022-05-25 NOTE — ED Provider Notes (Incomplete)
[facility] Provider Note    Event Date/Time   First MD Initiated Contact with Patient 05/25/22 2237     (approximate)   History   Knee Injury   HPI  Theresa Garrett is a 47 y.o. female who comes in from wheelchair due to injury for right knee injury.  Patient reports having a fall this morning at her house where she tripped landing on her right knee.  Patient reports that it was mechanical fall denies any chest pain, shortness of breath, LOC.  She reports landing directly on the knee.  She states that she had worked night shift and so after she fell she was able to get back into bed and was able to walk.  She then got up and noticed that it was a little bit more stiff but was still able to walk on it and went back to bed.  Then when she woke up again a second time she reports that seem more stiff and she had increasing pain.  She is able to bear some weight on it but she reported more pain and feeling like it was more stiff than prior.  She denies any hip pain ankle pain or other injuries.  She does report catching herself with her hands but denies any hand pain.  Did not hit her head did not lose consciousness.   Physical Exam   Triage Vital Signs: ED Triage Vitals  Enc Vitals Group     BP 05/25/22 2014 (!) 175/96     Pulse Rate 05/25/22 2014 96     Resp 05/25/22 2014 20     Temp 05/25/22 2014 98.2 F (36.8 C)     Temp src --      SpO2 05/25/22 2014 98 %     Weight 05/25/22 2018 180 lb (81.6 kg)     Height 05/25/22 2018 5\' 3"  (1.6 m)     Head Circumference --      Peak Flow --      Pain Score 05/25/22 2015 10     Pain Loc --      Pain Edu? --      Excl. in [place]? --     Most recent vital signs: Vitals:   05/25/22 2014  BP: (!) 175/96  Pulse: 96  Resp: 20  Temp: 98.2 F (36.8 C)  SpO2: 98%     General: Awake, no distress.  CV:  Good peripheral perfusion.  Resp:  Normal effort.  Abd:  No distention.  Other:  Patient has some tenderness  noted on her knee, good distal pulse sensation intact able to flex and extend the ankle.  Able to flex and extend the knee but slightly limited secondary to pain.  She is able to ambulate with a limp secondary to pain in the knee. No snuff box tenderness  ED Results / Procedures / Treatments   Labs (all labs ordered are listed, but only abnormal results are displayed) Labs Reviewed - No data to display     RADIOLOGY I have reviewed the xray personally and interpreted no evidence of any fracture but does have a effusion   PROCEDURES:  Critical Care performed: No  Procedures   MEDICATIONS ORDERED IN ED: Medications - No data to display   IMPRESSION / MDM / ASSESSMENT AND PLAN / ED COURSE  I reviewed the triage vital signs and the nursing notes.   Patient's presentation is most consistent with acute, uncomplicated illness.   Patient  comes in with a fall landing on her right knee.  X-rays ordered without any obvious fracture and patient had been ambulatory after the incident so seems less likely to be a tibial plateau fracture.  Suspect more likely related to ligament injury, effusion, contusion,.  Patient neurovascularly intact.  We discussed knee brace, crutches, oxycodone for pain.  She understands not to drive or work on the oxycodone.  CT imaging was done to rule out tibial plateau it was negative.  Patient has an effusion most likely traumatic she will follow-up outpatient.  Understands not to drive or work on oxycodone  FINAL CLINICAL IMPRESSION(S) / ED DIAGNOSES   Final diagnoses:  Injury of right knee, initial encounter     Rx / DC Orders   ED Discharge Orders          Ordered    oxyCODONE (ROXICODONE) 5 MG immediate release tablet  Every 6 hours PRN        05/25/22 2353             Note:  This document was prepared using Dragon voice recognition software and may include unintentional dictation errors.   Concha Se, MD 05/26/22 0005    Concha Se, MD 05/26/22 [phone]

## 2022-05-25 NOTE — Discharge Instructions (Addendum)
CT imaging shows large effusion most likely traumatic from the fall.  There is no evidence of any fracture.  Please follow-up with orthopedics using crutches and return to the ER if you develop worsening symptoms fevers or any other concerns.  Take Tylenol 1 g every 8 hours, ibuprofen for pain and return to the ER.  Take oxycodone as prescribed. Do not drink alcohol, drive or participate in any other potentially dangerous activities while taking this medication as it may make you sleepy. Do not take this medication with any other sedating medications, either prescription or over-the-counter. If you were prescribed Percocet or Vicodin, do not take these with acetaminophen (Tylenol) as it is already contained within these medications.  This medication is an opiate (or narcotic) pain medication and can be habit forming. Use it as little as possible to achieve adequate pain control. Do not use or use it with extreme caution if you have a history of opiate abuse or dependence. If you are on a pain contract with your primary care doctor or a pain specialist, be sure to let them know you were prescribed this medication today from the Emergency Department. This medication is intended for your use only - do not give any to anyone else and keep it in a secure place where nobody else, especially children, have access to it.    Large knee joint effusion.  No acute fracture is seen.    Degenerative changes of the knee joint are seen.

## 2022-06-27 IMAGING — DX DG FOOT COMPLETE 3+V*L*
3 series · 3 of 3 positions shown · non-contrast
Comparison: None.

CLINICAL DATA: Left foot pain

EXAM:
LEFT FOOT - COMPLETE 3+ VIEW

[foot ap]
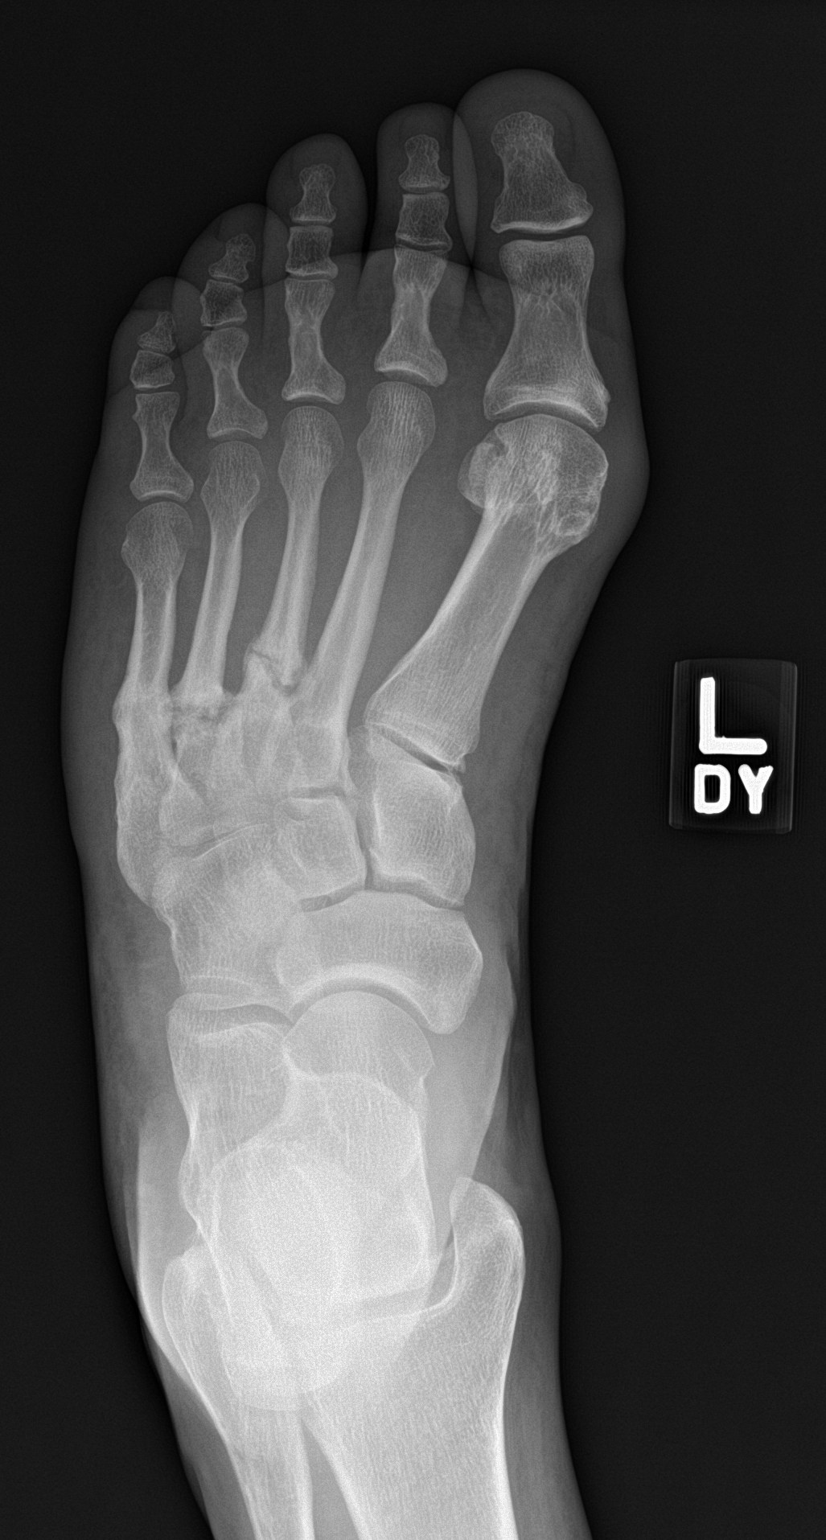

[foot obl]
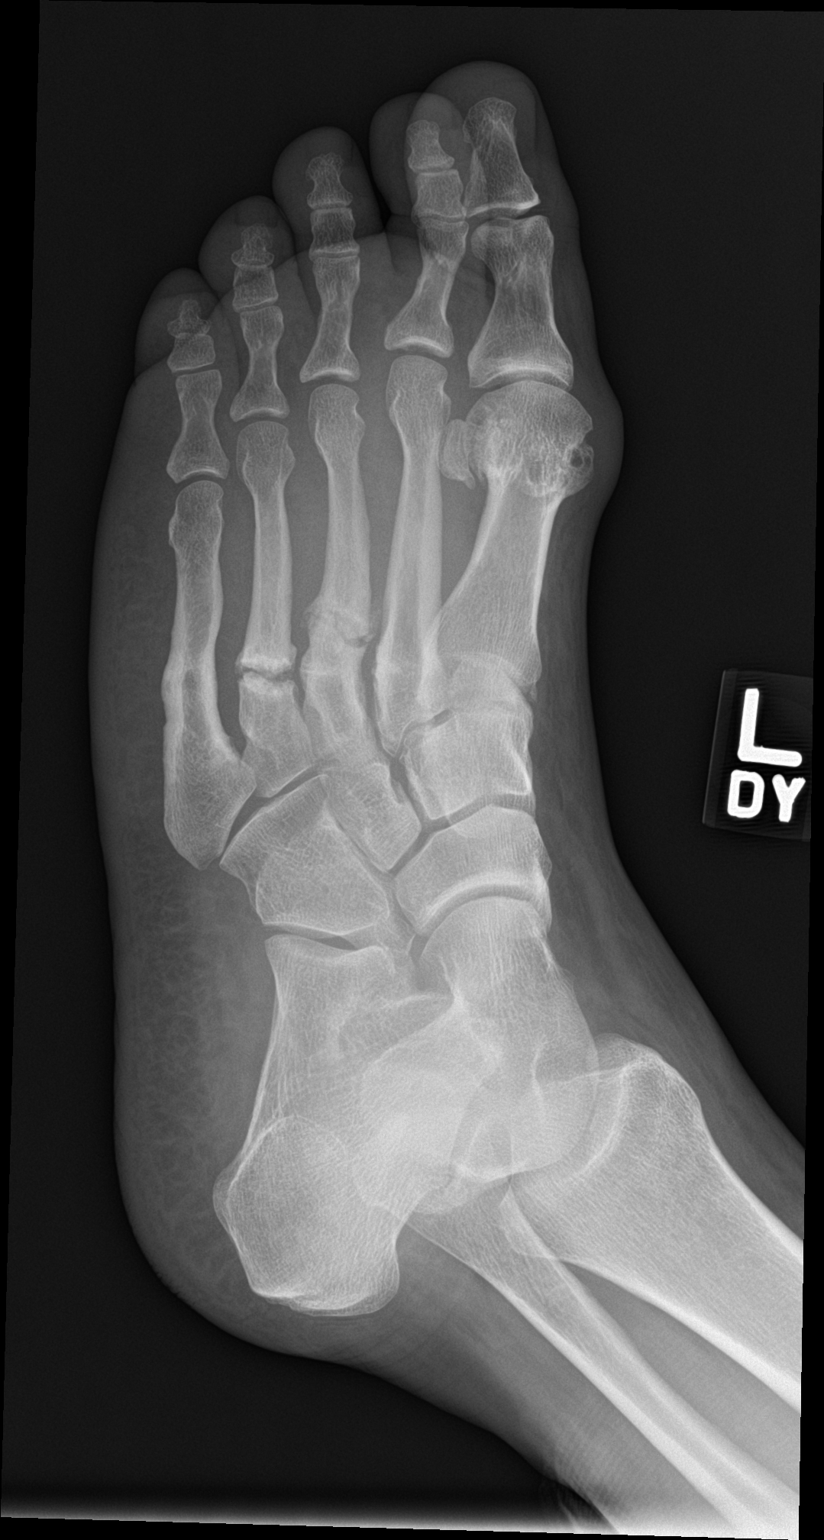

[foot lat]
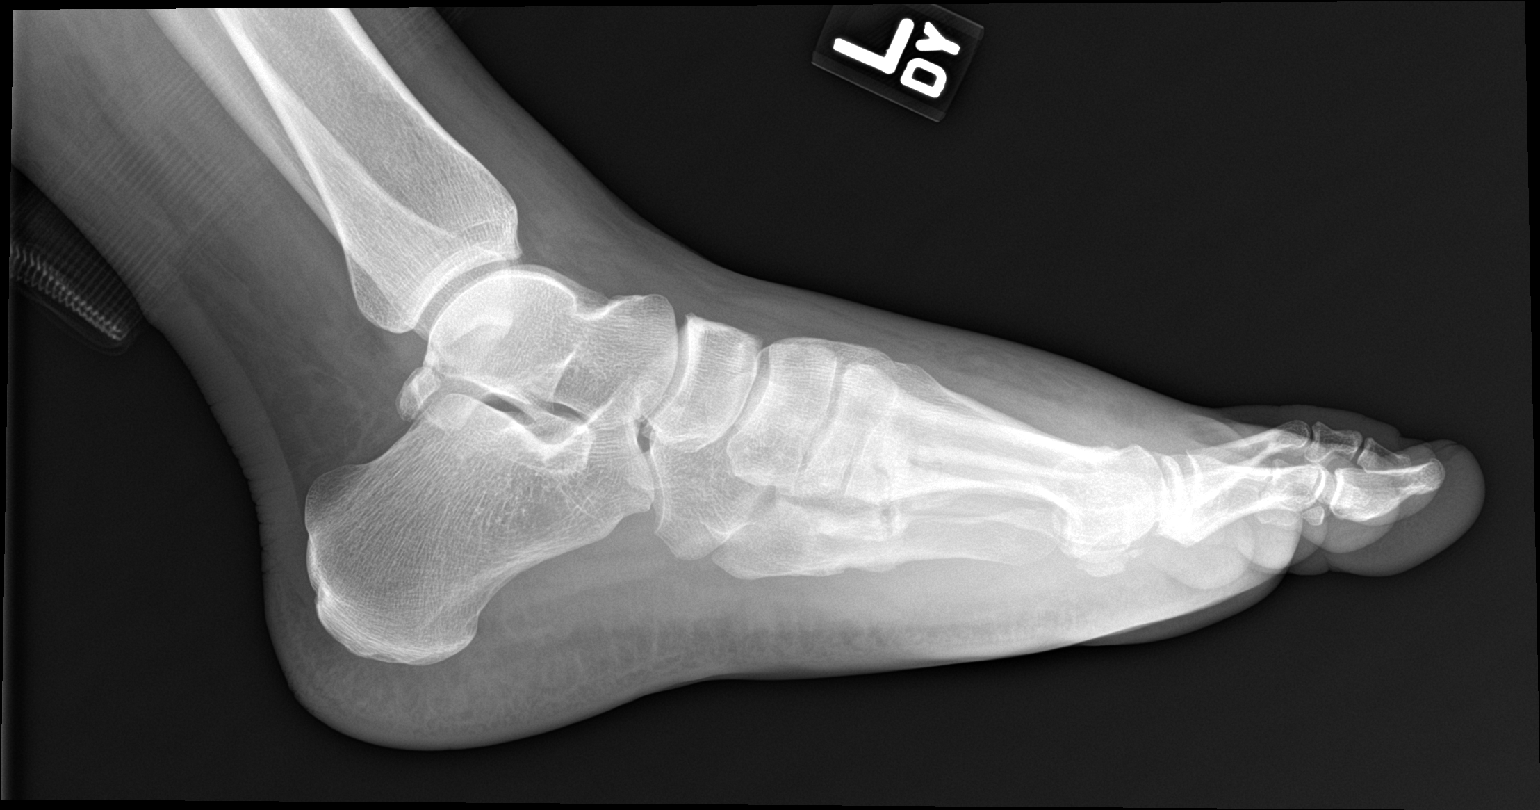

[3 of 3 positions shown; findings below may reference images not displayed]

FINDINGS: Chronic appearing fractures of the proximal third and fourth
metatarsal diaphyses, both of which appear partially ununited.
Chronic appearing fracture of the fifth metatarsal diaphysis with
bridging callus formation over the fracture site. Suspect healed
fracture of the proximal second metatarsal diaphysis.

No evidence of acute fracture of the left foot. No dislocation. Mild
hallux valgus. Mild soft tissue swelling at the forefoot.
IMPRESSION: 1. Chronic appearing fractures of the proximal third, fourth and
fifth metatarsal diaphyses. The third and fourth metatarsal
fractures appear partially ununited.
2. Suspect healed fracture of the proximal second metatarsal
diaphysis.

## 2023-02-16 ENCOUNTER — Emergency Department: Payer: 59

## 2023-02-16 ENCOUNTER — Emergency Department: Payer: Self-pay

## 2023-02-16 ENCOUNTER — Emergency Department
Admission: EM | Admit: 2023-02-16 | Discharge: 2023-02-16 | Disposition: A | Discharge status: Other Acute Inpatient Hospital | Payer: 59 | Attending: Emergency Medicine | Admitting: Emergency Medicine

## 2023-02-16 ENCOUNTER — Encounter: Payer: Self-pay | Admitting: Emergency Medicine

## 2023-02-16 DIAGNOSIS — R4182 Altered mental status, unspecified: Secondary | ICD-10-CM | POA: Diagnosis present

## 2023-02-16 DIAGNOSIS — I609 Nontraumatic subarachnoid hemorrhage, unspecified: Secondary | ICD-10-CM | POA: Diagnosis not present

## 2023-02-16 LAB — ETHANOL: Alcohol, Ethyl (B): <10 mg/dL (ref <10)

## 2023-02-16 LAB — HEPATIC FUNCTION PANEL
ALT: 14 U/L (ref 0–44)
AST: 32 U/L (ref 15–41)
Albumin: 4.2 g/dL (ref 3.5–5.0)
Alkaline Phosphatase: 64 U/L (ref 38–126)
Bilirubin, Direct: <0.1 mg/dL (ref 0.0–0.2)
Total Bilirubin: 0.7 mg/dL (ref 0.0–1.2)
Total Protein: 7.4 g/dL (ref 6.5–8.1)

## 2023-02-16 LAB — BLOOD GAS, VENOUS
Acid-base deficit: 2.9 mmol/L — ABNORMAL HIGH (ref 0.0–2.0)
Bicarbonate: 23.7 mmol/L (ref 20.0–28.0)
FIO2: 60 %
MECHVT: 450 mL
Mechanical Rate: 16
O2 Saturation: 90.2 %
PEEP: 8 cmH2O
Patient temperature: 37
pCO2, Ven: 47 mm[Hg] (ref 44–60)
pH, Ven: 7.31 (ref 7.25–7.43)
pO2, Ven: 59 mm[Hg] — ABNORMAL HIGH (ref 32–45)

## 2023-02-16 LAB — BASIC METABOLIC PANEL
Anion gap: 17 — ABNORMAL HIGH (ref 5–15)
BUN: 8 mg/dL (ref 6–20)
CO2: 19 mmol/L — ABNORMAL LOW (ref 22–32)
Calcium: 8.9 mg/dL (ref 8.9–10.3)
Chloride: 95 mmol/L — ABNORMAL LOW (ref 98–111)
Creatinine, Ser: 0.59 mg/dL (ref 0.44–1.00)
GFR, Estimated: >60 mL/min (ref >60)
Glucose, Bld: 177 mg/dL — ABNORMAL HIGH (ref 70–99)
Potassium: 2.8 mmol/L — ABNORMAL LOW (ref 3.5–5.1)
Sodium: 131 mmol/L — ABNORMAL LOW (ref 135–145)

## 2023-02-16 LAB — URINALYSIS, ROUTINE W REFLEX MICROSCOPIC
Bacteria, UA: NONE SEEN
Bilirubin Urine: NEGATIVE
Glucose, UA: 50 mg/dL — AB
Hgb urine dipstick: NEGATIVE
Ketones, ur: 5 mg/dL — AB
Leukocytes,Ua: NEGATIVE
Nitrite: NEGATIVE
Protein, ur: 30 mg/dL — AB
Specific Gravity, Urine: 1.019 (ref 1.005–1.030)
pH: 5 (ref 5.0–8.0)

## 2023-02-16 LAB — URINE DRUG SCREEN, QUALITATIVE (ARMC ONLY)
Amphetamines, Ur Screen: NOT DETECTED
Barbiturates, Ur Screen: NOT DETECTED
Benzodiazepine, Ur Scrn: NOT DETECTED
Cannabinoid 50 Ng, Ur ~~LOC~~: NOT DETECTED
Cocaine Metabolite,Ur ~~LOC~~: NOT DETECTED
MDMA (Ecstasy)Ur Screen: NOT DETECTED
Methadone Scn, Ur: NOT DETECTED
Opiate, Ur Screen: POSITIVE — AB
Phencyclidine (PCP) Ur S: NOT DETECTED
Tricyclic, Ur Screen: NOT DETECTED

## 2023-02-16 LAB — CBC
HCT: 35.1 % — ABNORMAL LOW (ref 36.0–46.0)
Hemoglobin: 9 g/dL — ABNORMAL LOW (ref 12.0–15.0)
MCH: 14.8 pg — ABNORMAL LOW (ref 26.0–34.0)
MCHC: 25.6 g/dL — ABNORMAL LOW (ref 30.0–36.0)
MCV: 57.8 fL — ABNORMAL LOW (ref 80.0–100.0)
Platelets: 1115 10*3/uL (ref 150–400)
RBC: 6.07 MIL/uL — ABNORMAL HIGH (ref 3.87–5.11)
RDW: 23.9 % — ABNORMAL HIGH (ref 11.5–15.5)
WBC: 34.9 10*3/uL — ABNORMAL HIGH (ref 4.0–10.5)
nRBC: 0.2 % (ref 0.0–0.2)

## 2023-02-16 LAB — ACETAMINOPHEN LEVEL: Acetaminophen (Tylenol), Serum: <10 ug/mL — ABNORMAL LOW (ref 10–30)

## 2023-02-16 LAB — TROPONIN I (HIGH SENSITIVITY): Troponin I (High Sensitivity): 16 ng/L (ref <18)

## 2023-02-16 LAB — SALICYLATE LEVEL: Salicylate Lvl: <7 mg/dL — ABNORMAL LOW (ref 7.0–30.0)

## 2023-02-16 LAB — MAGNESIUM: Magnesium: 1.5 mg/dL — ABNORMAL LOW (ref 1.7–2.4)

## 2023-02-16 MED ORDER — SODIUM CHLORIDE 0.9 % IV SOLN: 250.0000 mL | INTRAVENOUS | Status: DC

## 2023-02-16 MED ORDER — NOREPINEPHRINE 4 MG/250ML-% IV SOLN
INTRAVENOUS | Status: AC
  Administered 2023-02-16: 2 ug/min via INTRAVENOUS
  Filled 2023-02-16: qty 250

## 2023-02-16 MED ORDER — ROCURONIUM BROMIDE 10 MG/ML (PF) SYRINGE
PREFILLED_SYRINGE | INTRAVENOUS | Status: AC
  Administered 2023-02-16: 80 mg via INTRAVENOUS
  Filled 2023-02-16: qty 10

## 2023-02-16 MED ORDER — PROPOFOL 1000 MG/100ML IV EMUL: 0.0000 ug/kg/min | INTRAVENOUS | Status: DC

## 2023-02-16 MED ORDER — LEVETIRACETAM IN NACL 1000 MG/100ML IV SOLN
1000.0000 mg | Freq: Once | INTRAVENOUS | Status: AC
  Administered 2023-02-16: 1000 mg via INTRAVENOUS
  Filled 2023-02-16: qty 100

## 2023-02-16 MED ORDER — PROPOFOL 1000 MG/100ML IV EMUL
INTRAVENOUS | Status: AC
  Administered 2023-02-16: 5 ug/kg/min via INTRAVENOUS
  Filled 2023-02-16: qty 100

## 2023-02-16 MED ORDER — NALOXONE HCL 2 MG/2ML IJ SOSY
0.4000 mg | PREFILLED_SYRINGE | Freq: Once | INTRAMUSCULAR | Status: AC
  Administered 2023-02-16: 0.4 mg via INTRAVENOUS

## 2023-02-16 MED ORDER — CLEVIDIPINE BUTYRATE 0.5 MG/ML IV EMUL
0.0000 mg/h | INTRAVENOUS | Status: DC
  Administered 2023-02-16: 2 mg/h via INTRAVENOUS
  Filled 2023-02-16: qty 50

## 2023-02-16 MED ORDER — MIDAZOLAM HCL 2 MG/2ML IJ SOLN
2.0000 mg | Freq: Once | INTRAMUSCULAR | Status: AC
  Administered 2023-02-16: 2 mg via INTRAVENOUS

## 2023-02-16 MED ORDER — ROCURONIUM BROMIDE 10 MG/ML (PF) SYRINGE: 80.0000 mg | PREFILLED_SYRINGE | Freq: Once | INTRAVENOUS | Status: AC

## 2023-02-16 MED ORDER — IOHEXOL 350 MG/ML SOLN
75.0000 mL | Freq: Once | INTRAVENOUS | Status: AC | PRN
  Administered 2023-02-16: 75 mL via INTRAVENOUS

## 2023-02-16 MED ORDER — SODIUM CHLORIDE 0.9 % IV BOLUS
1000.0000 mL | Freq: Once | INTRAVENOUS | Status: AC
Start: 2023-02-16 — End: 2023-02-16
  Administered 2023-02-16: 1000 mL via INTRAVENOUS

## 2023-02-16 MED ORDER — SODIUM CHLORIDE 0.9 % IV BOLUS
1000.0000 mL | Freq: Once | INTRAVENOUS | Status: AC
  Administered 2023-02-16: 1000 mL via INTRAVENOUS

## 2023-02-16 MED ORDER — PROPOFOL 1000 MG/100ML IV EMUL
0.0000 ug/kg/min | INTRAVENOUS | Status: DC
  Administered 2023-02-16: 15 ug/kg/min via INTRAVENOUS

## 2023-02-16 MED ORDER — MIDAZOLAM HCL 2 MG/2ML IJ SOLN
1.0000 mg | INTRAMUSCULAR | Status: DC | PRN
  Filled 2023-02-16: qty 2

## 2023-02-16 MED ORDER — FENTANYL CITRATE PF 50 MCG/ML IJ SOSY: 50.0000 ug | PREFILLED_SYRINGE | INTRAMUSCULAR | Status: DC | PRN

## 2023-02-16 MED ORDER — ETOMIDATE 2 MG/ML IV SOLN: 20.0000 mg | Freq: Once | INTRAVENOUS | Status: AC

## 2023-02-16 MED ORDER — ETOMIDATE 2 MG/ML IV SOLN
INTRAVENOUS | Status: AC
  Administered 2023-02-16: 20 mg via INTRAVENOUS
  Filled 2023-02-16: qty 10

## 2023-02-16 MED ORDER — NIMODIPINE 30 MG PO CAPS
30.0000 mg | ORAL_CAPSULE | Freq: Once | ORAL | Status: DC
  Filled 2023-02-16: qty 1

## 2023-02-16 MED ORDER — NOREPINEPHRINE 4 MG/250ML-% IV SOLN: 2.0000 ug/min | INTRAVENOUS | Status: DC

## 2023-02-16 NOTE — ED Notes (Signed)
 Christopher L. [place] (Son)  [phone]

## 2023-02-16 NOTE — ED Notes (Signed)
 Bair hugger warmer started

## 2023-02-16 NOTE — ED Triage Notes (Signed)
 Pt arrived via [facility] from home unresponsive with agonal, shallow breathing with heavy secretions in airway. Pt unresponsive with initial flexion to painful stimuli but change to no reaction and posturing of extremities. EDP, [place] at bedside with request to intubate pt. Respiratory in route with equipment set up. Per EMS, pt partook in heavy alcohol consumption over the last 24 hours with hx/o ETOH abuse and tonight family witnessed pt go unresponsive.

## 2023-02-16 NOTE — ED Notes (Signed)
 EDP, Jessup at head of bed with successful airway established at this time.

## 2023-02-16 NOTE — ED Notes (Signed)
 [employer] is sending a helo

## 2023-02-16 NOTE — ED Notes (Signed)
 Patient belongings given to children, Lonni (eldest son) and Rosina (eldest daughter) after items were placed in a belongings bag. In addition to this, the tongue ring and nose ring of patient still in place due to being unable to be removed by medical staff.

## 2023-02-16 NOTE — Progress Notes (Signed)
 Transported to CT and back with no events.

## 2023-02-16 NOTE — ED Notes (Signed)
 Per ED MD Jessup, respiratory called for  ETT to be moved 2cm up from bronchus at this time.

## 2023-02-16 NOTE — ED Notes (Signed)
 ..  EMTALA: REQUIRED DOCUMENTATION COMPLETED AND REVIEWED BY WRITER PRIOR TO PT TRANSFER MD REASSESSMENT EMTALA RN SECTION TRANSFER E-SIGN VS WITHIN REQUIRED TIME

## 2023-02-16 NOTE — ED Provider Notes (Signed)
 [facility] Provider Note    Event Date/Time   First MD Initiated Contact with Patient 02/16/23 1951     (approximate)   History   Chief Complaint Altered Mental Status   HPI  Theresa Garrett is a 48 y.o. female with no significant past medical history who presents to the ED for altered mental status.  History is limited as patient arrives unresponsive.  Per EMS, patient has been complaining of headache for much of the day, has also been feeling nauseous with multiple episodes of vomiting.  Family then contacted EMS when patient became less responsive this evening.  When EMS arrived, they found patient to be minimally responsive but localizing to painful stimuli.  On arrival to the ED, patient is completely unresponsive with sonorous respirations.     Physical Exam   Triage Vital Signs: ED Triage Vitals  Encounter Vitals Group     BP      Systolic BP Percentile      Diastolic BP Percentile      Pulse      Resp      Temp      Temp src      SpO2      Weight      Height      Head Circumference      Peak Flow      Pain Score      Pain Loc      Pain Education      Exclude from Growth Chart     Most recent vital signs: Vitals:   02/16/23 2215 02/16/23 2218  BP: (!) 120/56 (!) 124/57  Pulse: 97 97  Resp: 16 16  Temp: (!) 95.3 F (35.2 C) (!) 95.3 F (35.2 C)  SpO2:  96%    Constitutional: Unresponsive. Eyes: Conjunctivae are normal.  Pupil dilated on right compared to left, both are minimally reactive to light. Head: Atraumatic. Nose: No congestion/rhinnorhea. Mouth/Throat: Mucous membranes are moist.  Cardiovascular: Tachycardic, regular rhythm. Grossly normal heart sounds.  2+ radial pulses bilaterally. Respiratory: Gagging respirations. Gastrointestinal: Soft and nondistended. Musculoskeletal: No lower extremity tenderness nor edema.  Neurologic: No response to painful stimuli, decerebrate posturing noted.    ED Results /  Procedures / Treatments   Labs (all labs ordered are listed, but only abnormal results are displayed) Labs Reviewed  BASIC METABOLIC PANEL - Abnormal; Notable for the following components:      Result Value   Sodium 131 (*)    Potassium 2.8 (*)    Chloride 95 (*)    CO2 19 (*)    Glucose, Bld 177 (*)    Anion gap 17 (*)    All other components within normal limits  CBC - Abnormal; Notable for the following components:   WBC 34.9 (*)    RBC 6.07 (*)    Hemoglobin 9.0 (*)    HCT 35.1 (*)    MCV 57.8 (*)    MCH 14.8 (*)    MCHC 25.6 (*)    RDW 23.9 (*)    Platelets 1,115 (*)    All other components within normal limits  URINALYSIS, ROUTINE W REFLEX MICROSCOPIC - Abnormal; Notable for the following components:   Color, Urine STRAW (*)    APPearance CLEAR (*)    Glucose, UA 50 (*)    Ketones, ur 5 (*)    Protein, ur 30 (*)    All other components within normal limits  URINE DRUG SCREEN, QUALITATIVE (  [facility] ONLY) - Abnormal; Notable for the following components:   Opiate, Ur Screen POSITIVE (*)    All other components within normal limits  SALICYLATE LEVEL - Abnormal; Notable for the following components:   Salicylate Lvl <7.0 (*)    All other components within normal limits  ACETAMINOPHEN  LEVEL - Abnormal; Notable for the following components:   Acetaminophen  (Tylenol ), Serum <10 (*)    All other components within normal limits  BLOOD GAS, VENOUS - Abnormal; Notable for the following components:   pO2, Ven 59 (*)    Acid-base deficit 2.9 (*)    All other components within normal limits  MAGNESIUM - Abnormal; Notable for the following components:   Magnesium 1.5 (*)    All other components within normal limits  ETHANOL  HEPATIC FUNCTION PANEL  TRIGLYCERIDES  CBG MONITORING, ED  POC URINE PREG, ED  TROPONIN I (HIGH SENSITIVITY)  TROPONIN I (HIGH SENSITIVITY)     EKG  ED ECG REPORT I, Carlin Palin, the attending physician, personally viewed and interpreted this  ECG.   Date: 02/16/2023  EKG Time: 20:55  Rate: 142  Rhythm: sinus tachycardia  Axis: LAD  Intervals:none  ST&T Change: diffuse ST depressions  RADIOLOGY CT head reviewed and interpreted by me with large volume subarachnoid hemorrhage.  PROCEDURES:  Critical Care performed: Yes, see critical care procedure note(s)  .Critical Care  Performed by: Palin Carlin, MD Authorized by: Palin Carlin, MD   Critical care provider statement:    Critical care time (minutes):  75   Critical care time was exclusive of:  Separately billable procedures and treating other patients and teaching time   Critical care was necessary to treat or prevent imminent or life-threatening deterioration of the following conditions:  CNS failure or compromise   Critical care was time spent personally by me on the following activities:  Development of treatment plan with patient or surrogate, discussions with consultants, evaluation of patient's response to treatment, examination of patient, ordering and review of laboratory studies, ordering and review of radiographic studies, ordering and performing treatments and interventions, pulse oximetry, re-evaluation of patient's condition and review of old charts   I assumed direction of critical care for this patient from another provider in my specialty: no     Care discussed with: accepting provider at another facility   Procedure Name: Intubation Date/Time: 02/16/2023 8:51 PM  Performed by: Palin Carlin, MDPre-anesthesia Checklist: Patient identified, Patient being monitored, Emergency Drugs available, Timeout performed and Suction available Oxygen Delivery Method: Non-rebreather mask Preoxygenation: Pre-oxygenation with 100% oxygen Induction Type: Rapid sequence Ventilation: Mask ventilation without difficulty Laryngoscope Size: Glidescope and 4 Grade View: Grade I Tube size: 7.5 mm Number of attempts: 1 Airway Equipment and Method:  Video-laryngoscopy Placement Confirmation: ETT inserted through vocal cords under direct vision, CO2 detector and Breath sounds checked- equal and bilateral Secured at: 24 cm Tube secured with: ETT holder Dental Injury: Teeth and Oropharynx as per pre-operative assessment        MEDICATIONS ORDERED IN ED: Medications  clevidipine  (CLEVIPREX ) infusion 0.5 mg/mL (0 mg/hr Intravenous Paused 02/16/23 2131)  niMODipine  (NIMOTOP ) capsule 30 mg (30 mg Oral Not Given 02/16/23 2102)  midazolam  (VERSED ) injection 1-2 mg (has no administration in time range)  fentaNYL  (SUBLIMAZE ) injection 50 mcg (has no administration in time range)  fentaNYL  (SUBLIMAZE ) injection 50-200 mcg (has no administration in time range)  propofol  (DIPRIVAN ) 1000 MG/100ML infusion (0 mcg/kg/min  94 kg Intravenous Paused 02/16/23 2129)  0.9 %  sodium chloride   infusion (0 mLs Intravenous Hold 02/16/23 2201)  norepinephrine  (LEVOPHED ) 4mg  in (0.016 mg/mL) premix infusion (6 mcg/min Intravenous Rate/Dose Change 02/16/23 2203)  etomidate  (AMIDATE ) injection 20 mg (20 mg Intravenous Given 02/16/23 1945)  rocuronium  (ZEMURON ) injection 80 mg (80 mg Intravenous Given 02/16/23 1945)  naloxone  (NARCAN ) injection 0.4 mg (0.4 mg Intravenous Given 02/16/23 1940)  iohexol  (OMNIPAQUE ) 350 MG/ML injection 75 mL (75 mLs Intravenous Contrast Given 02/16/23 2017)  levETIRAcetam  (KEPPRA ) IVPB 1000 mg/100 mL premix (0 mg Intravenous Stopped 02/16/23 2118)  midazolam  (VERSED ) injection 2 mg (2 mg Intravenous Given 02/16/23 2046)  sodium chloride  0.9 % bolus 1,000 mL (0 mLs Intravenous Stopped 02/16/23 2218)  sodium chloride  0.9 % bolus 1,000 mL (1,000 mLs Intravenous New Bag/Given 02/16/23 2200)     IMPRESSION / MDM / ASSESSMENT AND PLAN / ED COURSE  I reviewed the triage vital signs and the nursing notes.                              48 y.o. female with no significant past medical history who presents to the ED complaining of headache and neck pain  throughout the day with nausea and vomiting, subsequently found less responsive by family who contacted EMS.  Patient's presentation is most consistent with acute presentation with potential threat to life or bodily function.  Differential diagnosis includes, but is not limited to, intracranial hemorrhage, stroke, TIA, overdose, electrolyte abnormality, seizure.  Patient ill-appearing on arrival with sonorous and gasping respirations, appears to have decerebrate posturing with no response to painful stimuli, right pupil enlarged compared to left, both are minimally reactive.  Vital signs remarkable for significant hypertension with systolic BP in the 230s.  Patient was intubated and taken immediately to CT scanner, where CT head shows large volume subarachnoid hemorrhage, CT cervical spine negative for acute process.  Case discussed with Dr. Clois of neurosurgery, who recommends transfer to tertiary care center, agrees with plan for clevidipine  for BP control along with pneumonia being and Keppra  for seizure prophylaxis.  CT angio of head and neck performed, no obvious posterior circulation aneurysm noted however assessment of anterior circulation limited due to apparent occlusion of bilateral ICAs.  Radiology believes this likely due to pressure from [facility] and prognosis appears dire.  Poor prognosis discussed with family, patient with multiple adult children present at the bedside.  They would like to proceed with full scope of care, request transfer to [facility].  I spoke with Dr. Keller of neurosurgery at [facility], who declined transfer as he believes the situation to be futile and patient not to be a candidate for any surgical intervention.  Case discussed with Dr. Terra of neuro IR at [facility], who accepted patient for transfer.  Blood pressure had initially come down nicely with clevidipine , however the patient subsequently became hypotensive.  We will start on IV Levophed  and give fluid bolus to maintain systolic  BP of greater than 130.  Labs with significant leukocytosis, mild anemia, and thrombocytosis.  Mild hypokalemia noted without AKI, LFTs unremarkable.  Patient awaiting transfer to [facility] at this time.      FINAL CLINICAL IMPRESSION(S) / ED DIAGNOSES   Final diagnoses:  Subarachnoid hemorrhage (HCC)     Rx / DC Orders   ED Discharge Orders     None        Note:  This document was prepared using Dragon voice recognition software and may include unintentional dictation errors.  Willo Dunnings, MD 02/16/23 [phone]

## 2023-02-16 NOTE — Progress Notes (Signed)
   02/16/23 2025  Spiritual Encounters  Type of Visit Initial  Care provided to: Pt and family  Conversation partners present during encounter Nurse  Referral source Nurse (RN/NT/LPN)  Reason for visit Trauma  OnCall Visit Yes  Spiritual Framework  Presenting Themes Significant life change  Family Stress Factors Major life changes  Interventions  Spiritual Care Interventions Made Compassionate presence;Prayer;Bereavement/grief support   Chaplain responded to request to be available to family who received critical news on patient. Chaplain remained present with family until patient was transported to [facility].

## 2023-02-16 NOTE — ED Notes (Signed)
 Called [facility] for transfer, per Dr. Larinda Buttery. Images powershared, facesheet faxed.

## 2023-03-19 DEATH — deceased
